# Patient Record
Sex: Male | Born: 1985 | Race: Asian | Hispanic: No | Marital: Married | Smoking: Never smoker
Health system: Southern US, Community
[De-identification: ages and names within clinical notes are randomized; demographics above are authoritative.]

## PROBLEM LIST (undated history)

## (undated) DIAGNOSIS — I10 Essential (primary) hypertension: Secondary | ICD-10-CM

---

## 2015-10-15 ENCOUNTER — Encounter (HOSPITAL_COMMUNITY): Payer: Self-pay | Admitting: Emergency Medicine

## 2015-10-15 ENCOUNTER — Emergency Department (HOSPITAL_COMMUNITY)
Admission: EM | Admit: 2015-10-15 | Discharge: 2015-10-15 | Disposition: A | Discharge status: Home / Self Care | Payer: No Typology Code available for payment source | Attending: Emergency Medicine | Admitting: Emergency Medicine

## 2015-10-15 ENCOUNTER — Emergency Department (HOSPITAL_COMMUNITY): Payer: No Typology Code available for payment source

## 2015-10-15 DIAGNOSIS — S6991XA Unspecified injury of right wrist, hand and finger(s), initial encounter: Secondary | ICD-10-CM | POA: Diagnosis present

## 2015-10-15 DIAGNOSIS — I10 Essential (primary) hypertension: Secondary | ICD-10-CM | POA: Insufficient documentation

## 2015-10-15 DIAGNOSIS — S40812A Abrasion of left upper arm, initial encounter: Secondary | ICD-10-CM | POA: Diagnosis not present

## 2015-10-15 DIAGNOSIS — S50811A Abrasion of right forearm, initial encounter: Secondary | ICD-10-CM | POA: Insufficient documentation

## 2015-10-15 DIAGNOSIS — M25561 Pain in right knee: Secondary | ICD-10-CM

## 2015-10-15 DIAGNOSIS — Y92410 Unspecified street and highway as the place of occurrence of the external cause: Secondary | ICD-10-CM | POA: Diagnosis not present

## 2015-10-15 DIAGNOSIS — Y9389 Activity, other specified: Secondary | ICD-10-CM | POA: Insufficient documentation

## 2015-10-15 DIAGNOSIS — Y999 Unspecified external cause status: Secondary | ICD-10-CM | POA: Insufficient documentation

## 2015-10-15 HISTORY — DX: Essential (primary) hypertension: I10

## 2015-10-15 NOTE — Discharge Instructions (Signed)
Please read attached information. If you experience any new or worsening signs or symptoms please return to the emergency room for evaluation. Please follow-up with your primary care provider or specialist as discussed.  °

## 2015-10-15 NOTE — ED Notes (Signed)
PA at bedside.
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
